# Patient Record
Sex: Male | Born: 1980 | Race: White | Hispanic: No | Marital: Married | Smoking: Never smoker
Health system: Southern US, Community
[De-identification: ages and names within clinical notes are randomized; demographics above are authoritative.]

---

## 2014-07-23 ENCOUNTER — Ambulatory Visit (INDEPENDENT_AMBULATORY_CARE_PROVIDER_SITE_OTHER): Payer: 59 | Admitting: Internal Medicine

## 2014-07-23 VITALS — BP 114/78 | HR 99 | Temp 99.4°F | Resp 20 | Ht 67.0 in | Wt 171.0 lb

## 2014-07-23 DIAGNOSIS — R5081 Fever presenting with conditions classified elsewhere: Secondary | ICD-10-CM | POA: Diagnosis not present

## 2014-07-23 DIAGNOSIS — J209 Acute bronchitis, unspecified: Secondary | ICD-10-CM

## 2014-07-23 DIAGNOSIS — R05 Cough: Secondary | ICD-10-CM

## 2014-07-23 DIAGNOSIS — R059 Cough, unspecified: Secondary | ICD-10-CM

## 2014-07-23 MED ORDER — AZITHROMYCIN 250 MG PO TABS: ORAL_TABLET | ORAL | Status: DC

## 2014-07-23 MED ORDER — BENZONATATE 100 MG PO CAPS: 100.0000 mg | ORAL_CAPSULE | Freq: Three times a day (TID) | ORAL | Status: DC | PRN

## 2014-07-23 NOTE — Patient Instructions (Signed)
Zithromax as directed. Tessalon perle as directed. Increase fluids. advil for chills and fever. See your dr in follow up . If your symptoms worsen return to the ER.

## 2014-07-23 NOTE — Progress Notes (Signed)
   Subjective:    Patient ID: Jorge Dillon, male    DOB: 06/12/1980, 34 y.o.   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  Today with low grade temp despite otc advil and with continued chills, dry hacking  cough, no sputum, and aches.   Cant sleep because of the cough No nausea, vomiting, no abd pain no diarhhea,neg smoking   Review of Systems  HENT: Positive for congestion and rhinorrhea. Negative for ear discharge, ear pain and nosebleeds.   Respiratory: Positive for cough. Negative for apnea, chest tightness, shortness of breath, wheezing and stridor.   All other systems reviewed and are negative.      Objective:   Physical Exam  Constitutional: He is oriented to person, place, and time. He appears well-developed and well-nourished.  HENT:  Head: Normocephalic and atraumatic.  Right Ear: External ear normal.  Left Ear: External ear normal.  Nose: Nose normal.  Mouth/Throat: Oropharynx is clear and moist.  Eyes: Conjunctivae and EOM are normal. Pupils are equal, round, and reactive to light.  Neck: Normal range of motion.  Cardiovascular: Normal rate, regular rhythm, normal heart sounds and intact distal pulses.   Pulmonary/Chest: Effort normal and breath sounds normal.  Coughs with deep breath Coughs every few words No wheezing Sat 97%  Abdominal: Soft.  Musculoskeletal: Normal range of motion.  Neurological: He is alert and oriented to person, place, and time.  Skin: Skin is warm.  Psychiatric: He has a normal mood and affect. His behavior is normal. Judgment and thought content normal.  Nursing note and vitals reviewed.         Assessment & Plan:  1. Fever 2. Cough 3. Acute bronchitus  Will RX with zithromax and tessalon perles Increase fluids  advil or ibuprofen as directed for fever and aches.

## 2014-07-27 ENCOUNTER — Telehealth: Payer: Self-pay

## 2014-07-27 DIAGNOSIS — J209 Acute bronchitis, unspecified: Secondary | ICD-10-CM

## 2014-07-27 NOTE — Telephone Encounter (Signed)
Pt was seen for an illness and was given a z-pack.  He had took four days worth and they left to go out of town.  He only had today's dose left.  Pt's wife wants to know if we can call in only that one pill for him.  [phone]

## 2014-07-29 MED ORDER — AZITHROMYCIN 250 MG PO TABS: 250.0000 mg | ORAL_TABLET | Freq: Once | ORAL | Status: DC

## 2014-07-29 NOTE — Telephone Encounter (Signed)
Sent a pill to the pharmacy

## 2014-07-30 NOTE — Telephone Encounter (Signed)
Left message letting his wife know.

## 2018-11-09 ENCOUNTER — Ambulatory Visit: Payer: Commercial Managed Care - PPO | Admitting: Podiatry

## 2018-11-10 ENCOUNTER — Other Ambulatory Visit: Payer: Self-pay

## 2018-11-10 ENCOUNTER — Ambulatory Visit: Payer: Commercial Managed Care - PPO | Admitting: Podiatry

## 2018-11-10 ENCOUNTER — Encounter: Payer: Self-pay | Admitting: Podiatry

## 2018-11-10 VITALS — BP 133/88 | HR 83 | Temp 98.0°F | Resp 16

## 2018-11-10 DIAGNOSIS — L309 Dermatitis, unspecified: Secondary | ICD-10-CM

## 2018-11-10 MED ORDER — TERBINAFINE HCL 250 MG PO TABS: 250.0000 mg | ORAL_TABLET | Freq: Every day | ORAL | 0 refills | Status: AC

## 2018-11-10 NOTE — Progress Notes (Signed)
Subjective:   Patient ID: Jorge Dillon, male   DOB: 38 y.o.   MRN: [id number]   HPI Patient presents with discoloration of the third and fourth digits right second toe left foot approximately 1 year duration states he is taken over-the-counter fungal topical medicine without relief of symptoms and states it does not hurt or there is been no blistering or itching.  Patient does not smoke likes to be active   Review of Systems  All other systems reviewed and are negative.       Objective:  Physical Exam Vitals signs and nursing note reviewed.  Constitutional:      Appearance: He is well-developed.  Pulmonary:     Effort: Pulmonary effort is normal.  Musculoskeletal: Normal range of motion.  Skin:    General: Skin is warm.  Neurological:     Mental Status: He is alert.     Neurovascular status found to be intact muscle strength is adequate range of motion within normal limit.  Patient is found to have crusted tissue on the third and fourth digits right and slight discoloration second digit left with no proximal edema erythema or drainage noted.  Patient has good digital perfusion     Assessment:  Probability that this is a fungal infection versus possible eczema or other unknown dermatological condition     Plan:  H&P condition reviewed and will get a start him on 45 days of oral antifungal with patient stating has had liver function studies which been normal.  I then went ahead and will start him on a combination fungal steroid cream and if symptoms continue I recommended he see a dermatologist over the next several months

## 2018-11-10 NOTE — Progress Notes (Signed)
   Subjective:    Patient ID: Jorge Dillon, male    DOB: June 30, 1980, 38 y.o.   MRN: [id number]  HPI    Review of Systems  All other systems reviewed and are negative.      Objective:   Physical Exam        Assessment & Plan:

## 2019-01-31 ENCOUNTER — Telehealth: Payer: Self-pay | Admitting: Gastroenterology

## 2019-01-31 NOTE — Telephone Encounter (Signed)
Dr Havery Moros this patient Jorge Dillon is wanting to transfer care from [facility] to [place] and is requesting you as his doctor. I have his medical records and am going to get them to you for review.

## 2019-02-02 NOTE — Telephone Encounter (Signed)
I accept his case and can see him in clinic.

## 2019-02-06 NOTE — Telephone Encounter (Signed)
Called patient to let him know that Dr. Havery Moros has reviewed his records and will see him. Left voicemail to call back and schedule.

## 2019-02-07 ENCOUNTER — Encounter: Payer: Self-pay | Admitting: Gastroenterology

## 2019-03-01 ENCOUNTER — Other Ambulatory Visit: Payer: Self-pay | Admitting: Gastroenterology

## 2019-03-01 DIAGNOSIS — R1011 Right upper quadrant pain: Secondary | ICD-10-CM

## 2019-03-02 ENCOUNTER — Ambulatory Visit
Admission: RE | Admit: 2019-03-02 | Discharge: 2019-03-02 | Disposition: A | Discharge status: Home / Self Care | Payer: Commercial Managed Care - PPO | Source: Ambulatory Visit | Attending: Gastroenterology | Admitting: Gastroenterology

## 2019-03-02 DIAGNOSIS — R1011 Right upper quadrant pain: Secondary | ICD-10-CM

## 2019-03-13 ENCOUNTER — Ambulatory Visit: Payer: Self-pay | Admitting: Gastroenterology

## 2020-04-20 IMAGING — US US ABDOMEN LIMITED
1 series · 14 of 25 positions shown · non-contrast
Comparison: None.

CLINICAL DATA: Mid abdomen pain for 1 month.

EXAM:
ULTRASOUND ABDOMEN LIMITED RIGHT UPPER QUADRANT

[Series 1: us abdomen limited · 0.19mm/px · 14 of 38 slices shown]
[im 1/38]
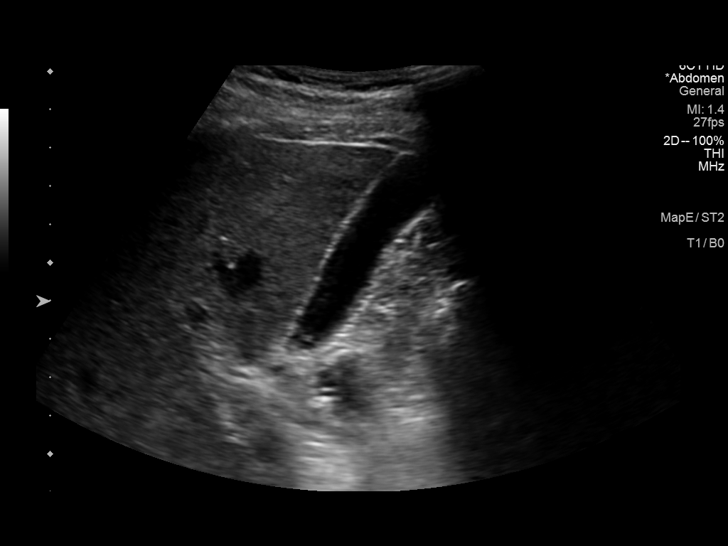
[im 4/38]
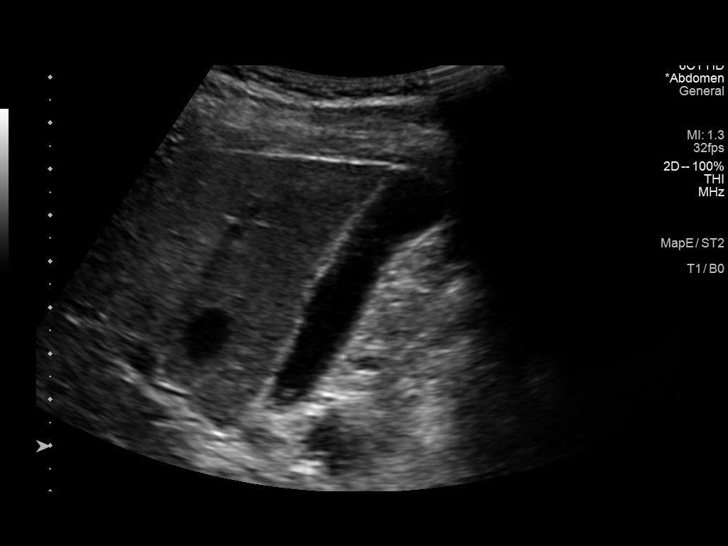
[im 7/38]
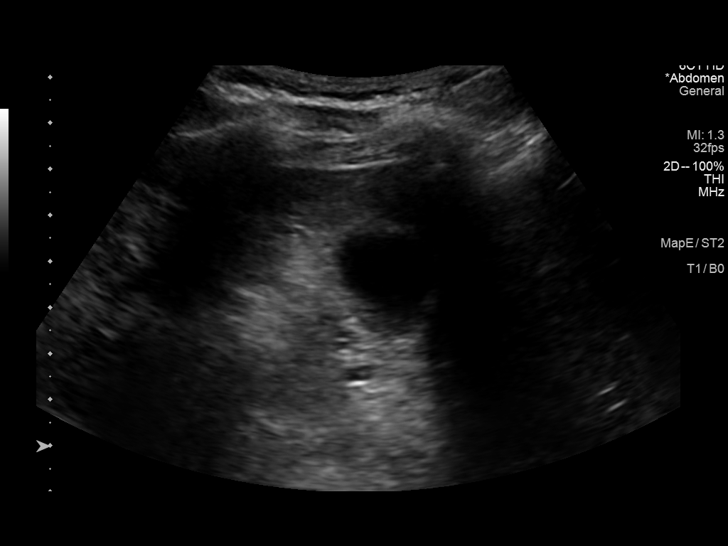
[im 10/38]
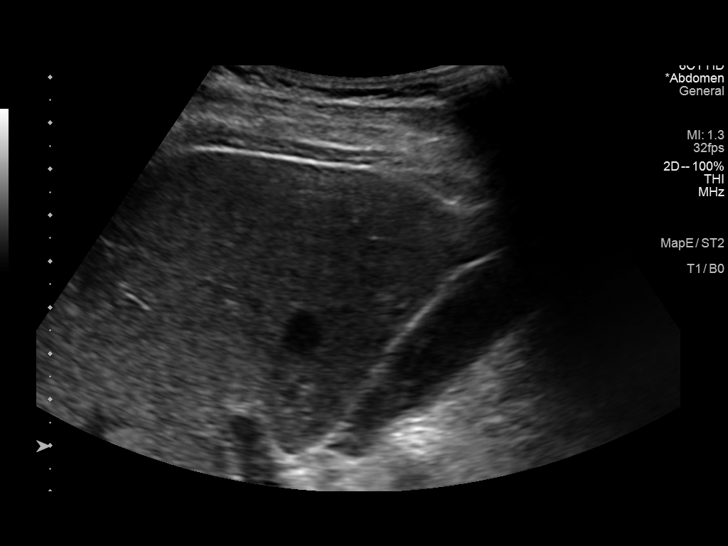
[im 13/38]
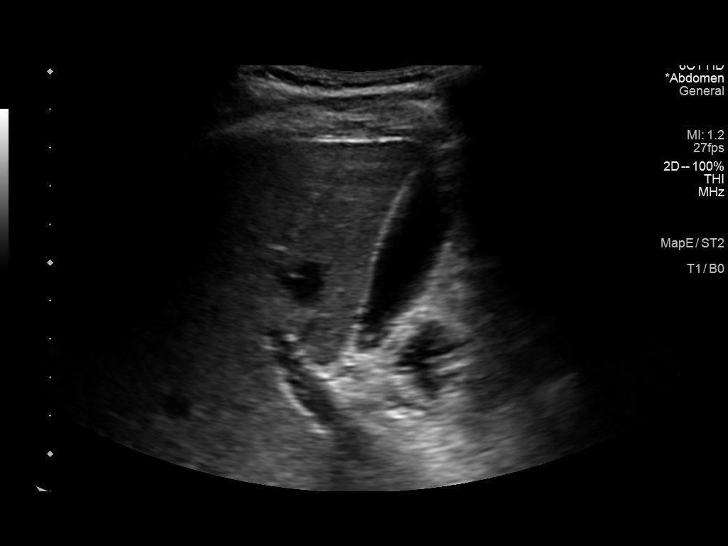
[im 14/38]
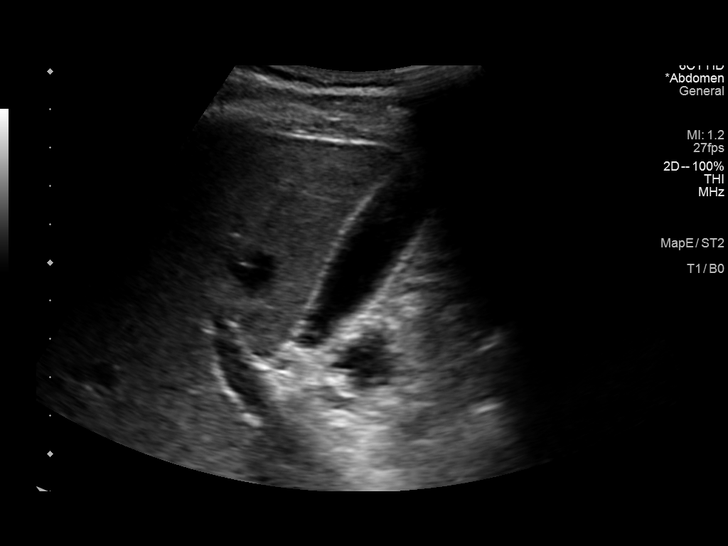
[im 17/38]
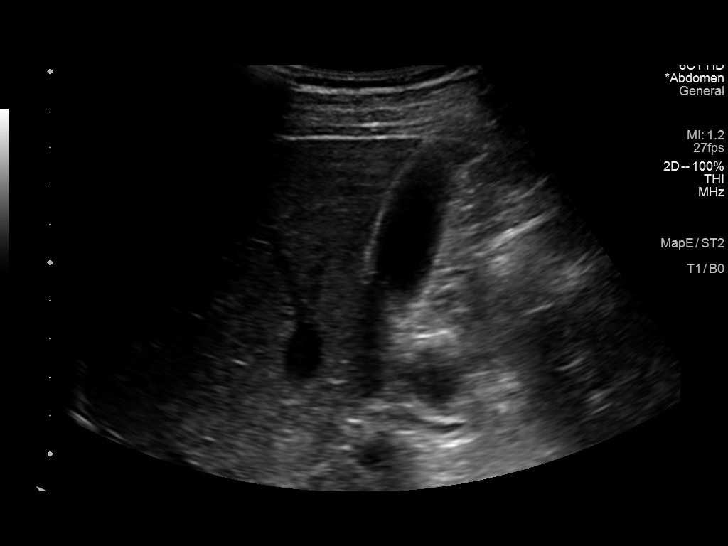
[im 21/38]
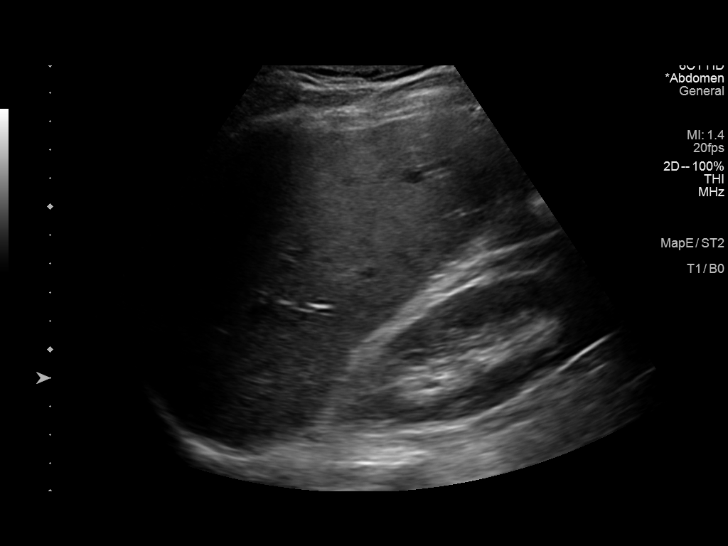
[im 24/38]
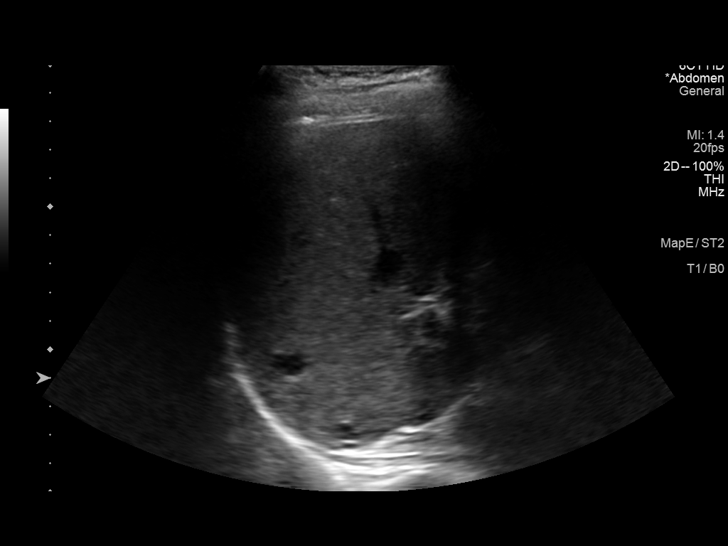
[im 25/38]
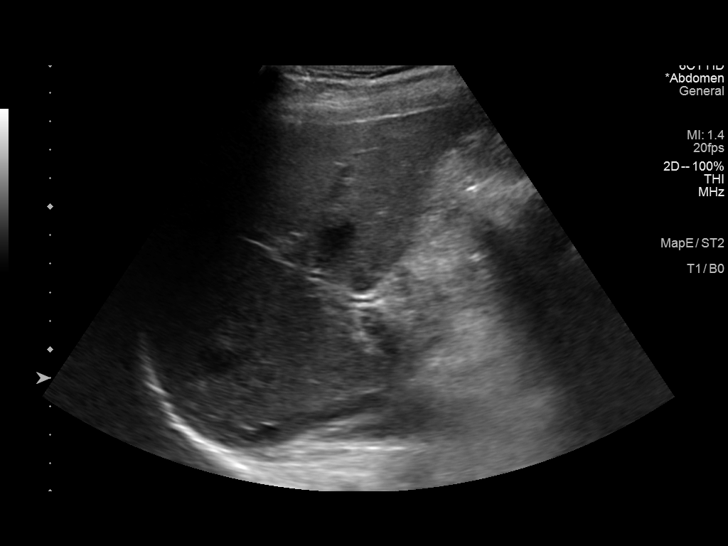
[im 28/38]
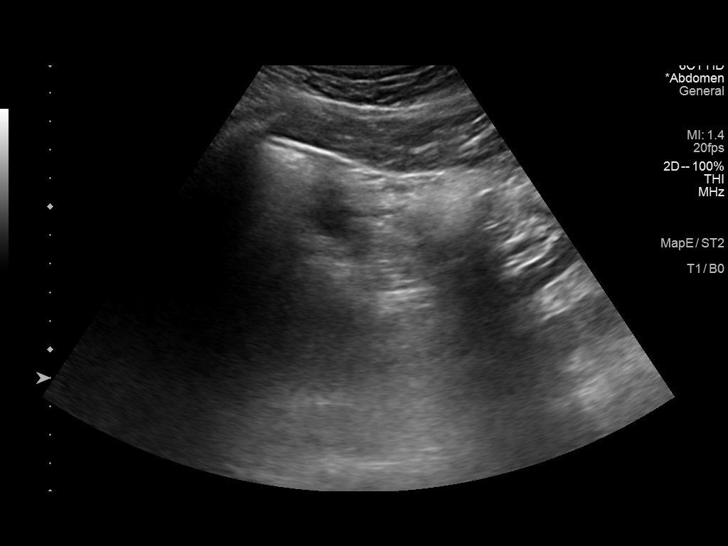
[im 31/38]
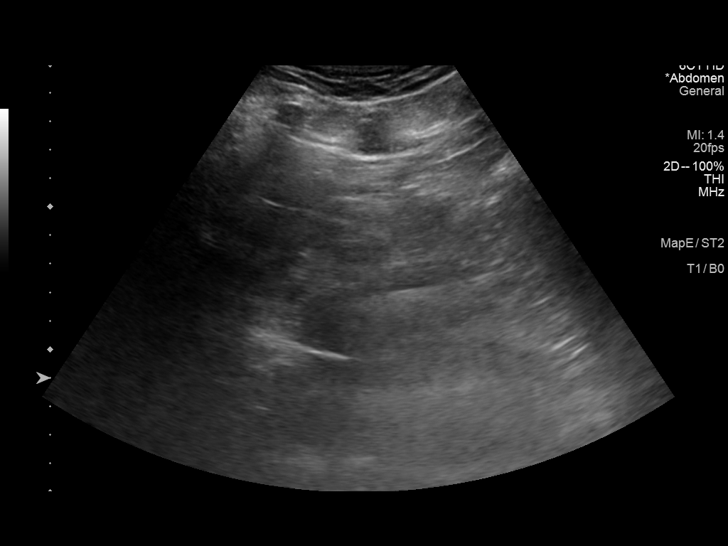
[im 34/38]
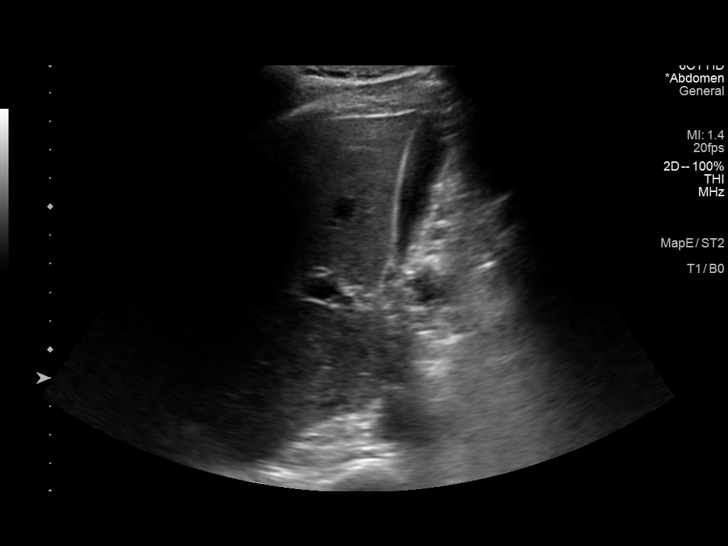
[im 38/38]
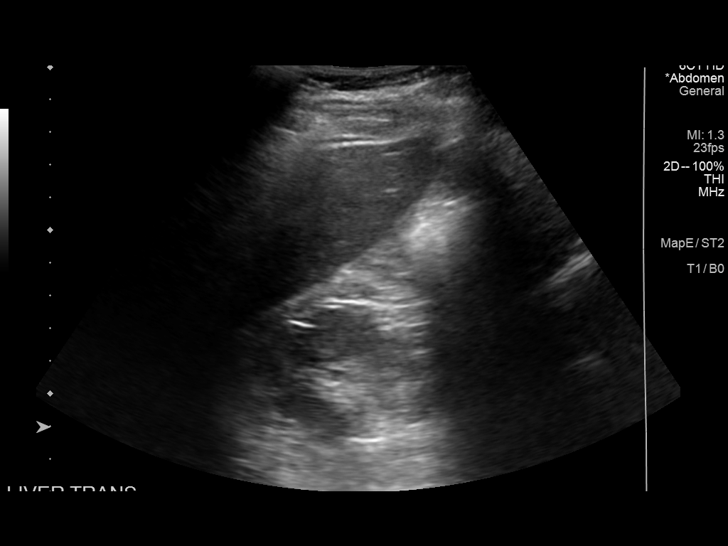

[14 of 25 positions shown; findings below may reference images not displayed]

FINDINGS: Gallbladder:

There is a question 3 mm gallbladder polyp. The gallbladder wall is
normal. There is no pericholecystic fluid. No sonographic Murphy
sign is noted.

Common bile duct:

Diameter: 2.6 mm

Liver:

No focal lesion identified. Within normal limits in parenchymal
echogenicity. Portal vein is patent on color Doppler imaging with
normal direction of blood flow towards the liver.

Other: None.
IMPRESSION: Question 3 mm gallbladder polyp. There is no evidence of acute
cholecystitis.

## 2020-08-02 ENCOUNTER — Other Ambulatory Visit: Payer: Self-pay | Admitting: Physician Assistant

## 2020-08-02 DIAGNOSIS — R1013 Epigastric pain: Secondary | ICD-10-CM

## 2020-08-08 ENCOUNTER — Ambulatory Visit
Admission: RE | Admit: 2020-08-08 | Discharge: 2020-08-08 | Disposition: A | Discharge status: Home / Self Care | Payer: Commercial Managed Care - PPO | Source: Ambulatory Visit | Attending: Physician Assistant | Admitting: Physician Assistant

## 2020-08-08 DIAGNOSIS — R1013 Epigastric pain: Secondary | ICD-10-CM

## 2020-08-26 ENCOUNTER — Other Ambulatory Visit: Payer: Commercial Managed Care - PPO

## 2021-09-27 IMAGING — US US ABDOMEN LIMITED RUQ/ASCITES
1 series · 14 of 25 positions shown · non-contrast
Comparison: Ultrasound abdomen 03/02/2019

CLINICAL DATA: Epigastric pain 2 weeks

EXAM:
ULTRASOUND ABDOMEN LIMITED RIGHT UPPER QUADRANT

[Series 1: us abdomen limited ruq/ascites · 0.14mm/px · 14 of 50 slices shown]
[im 1/50]
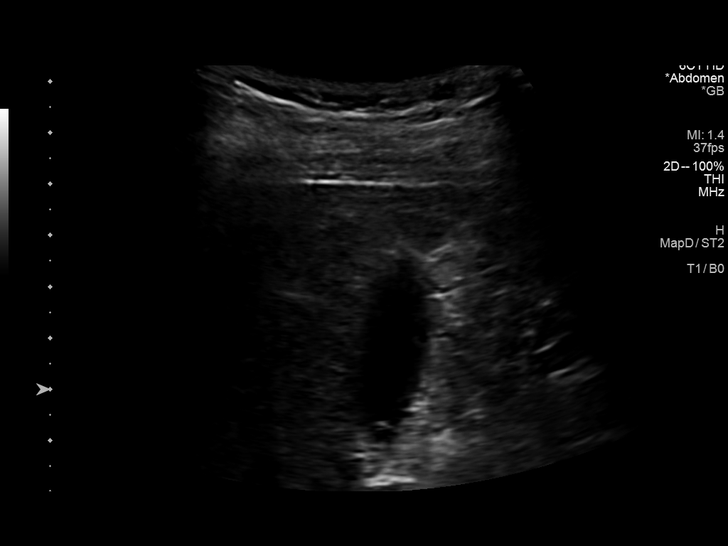
[im 5/50]
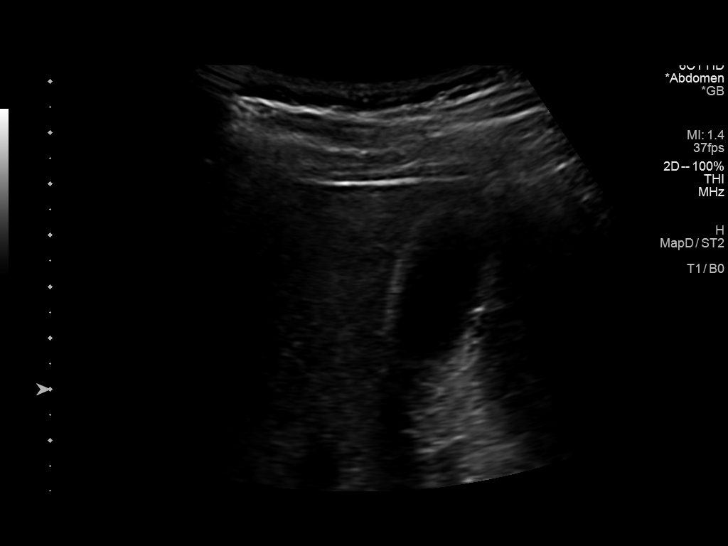
[im 9/50]
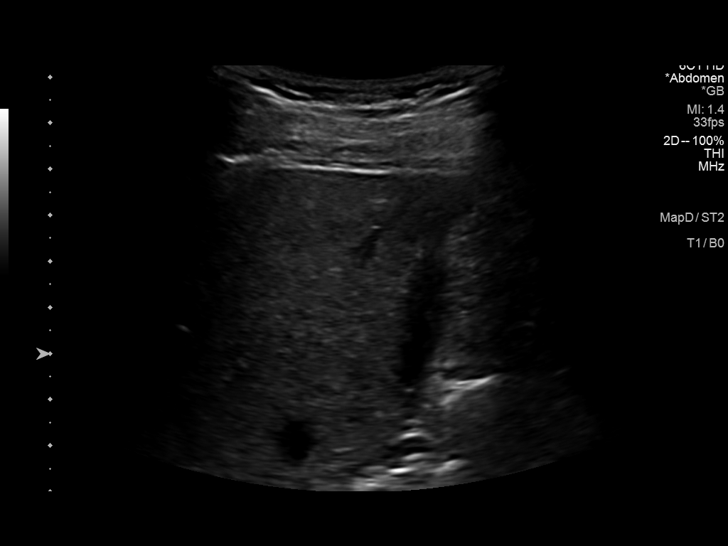
[im 13/50]
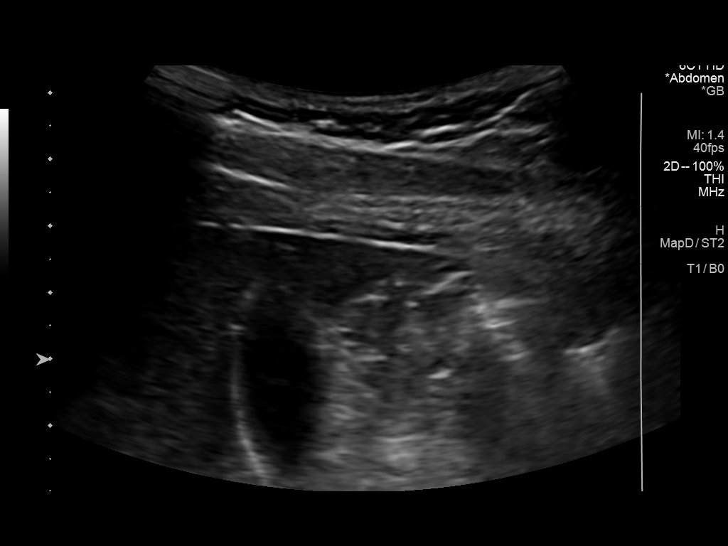
[im 17/50]
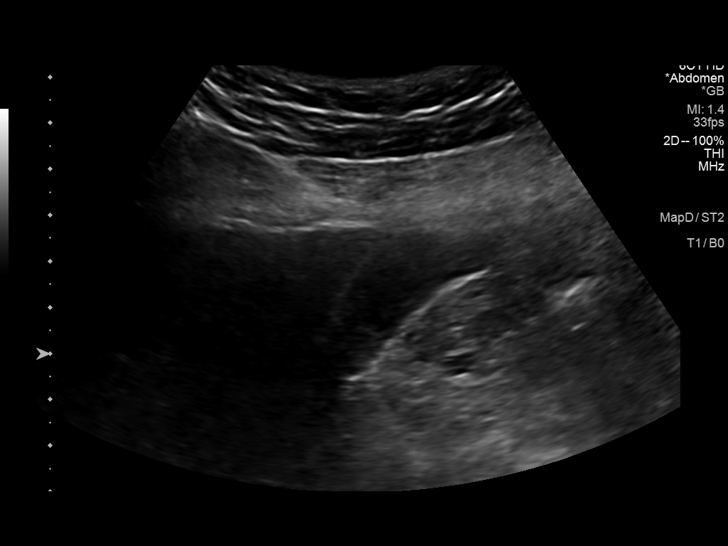
[im 19/50]
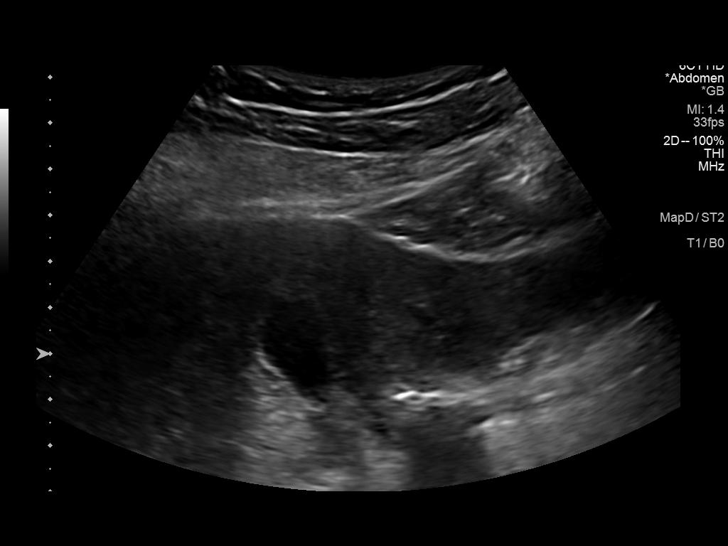
[im 23/50]
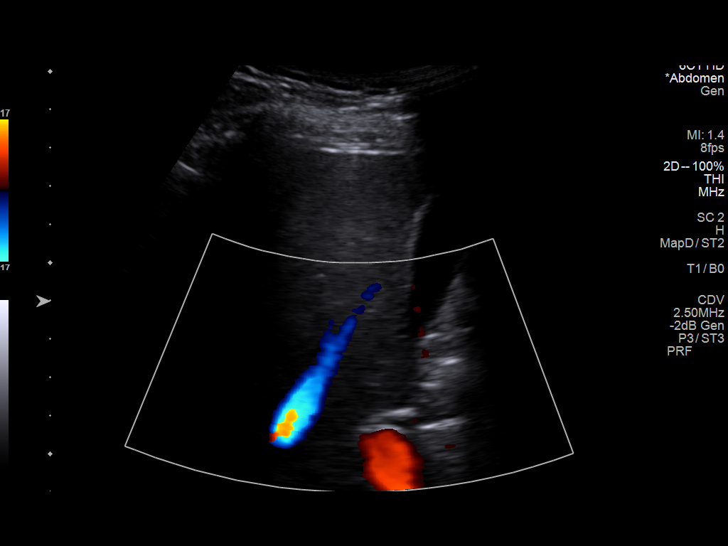
[im 27/50]
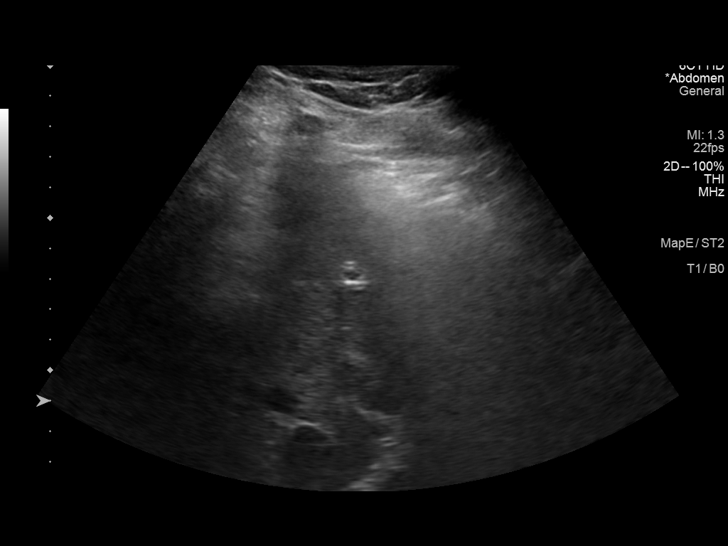
[im 31/50]
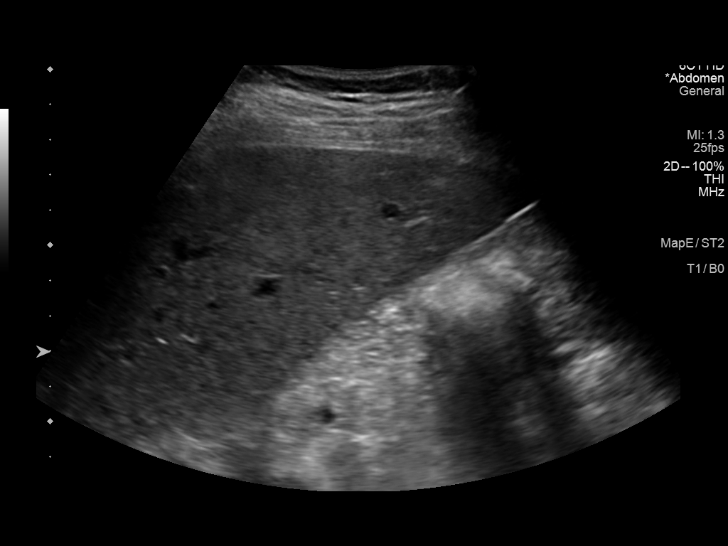
[im 33/50]
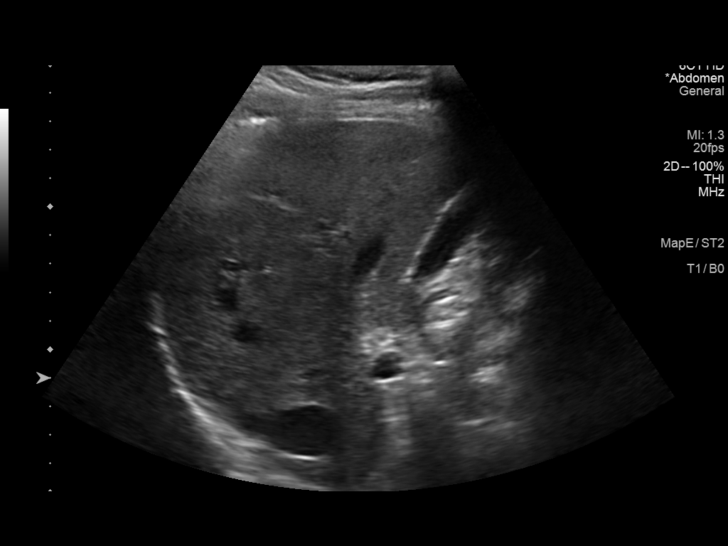
[im 37/50]
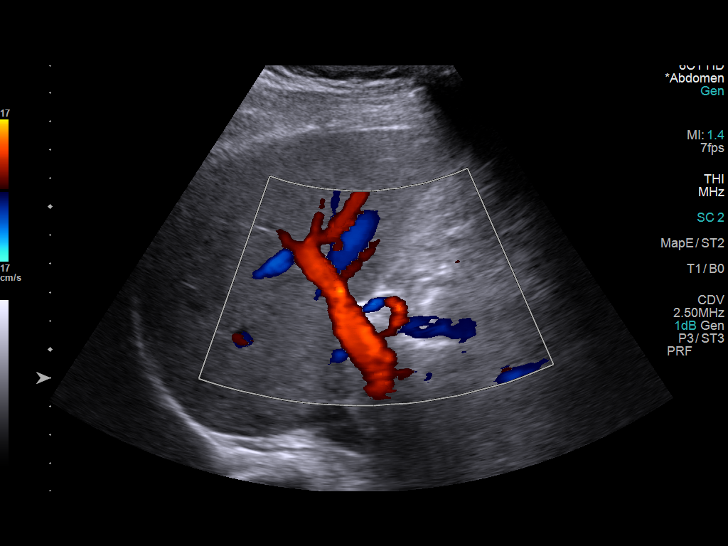
[im 41/50]
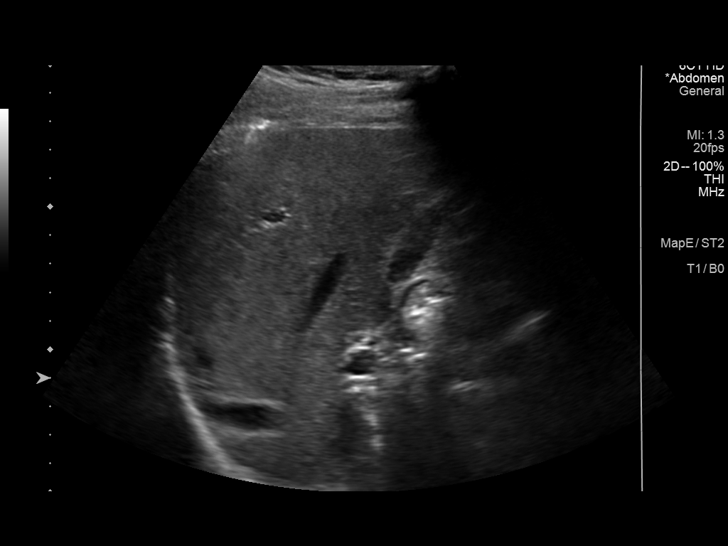
[im 45/50]
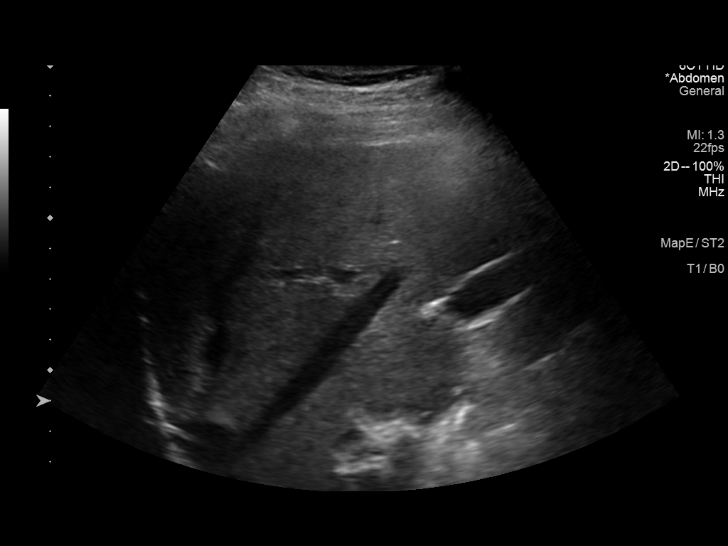
[im 50/50]
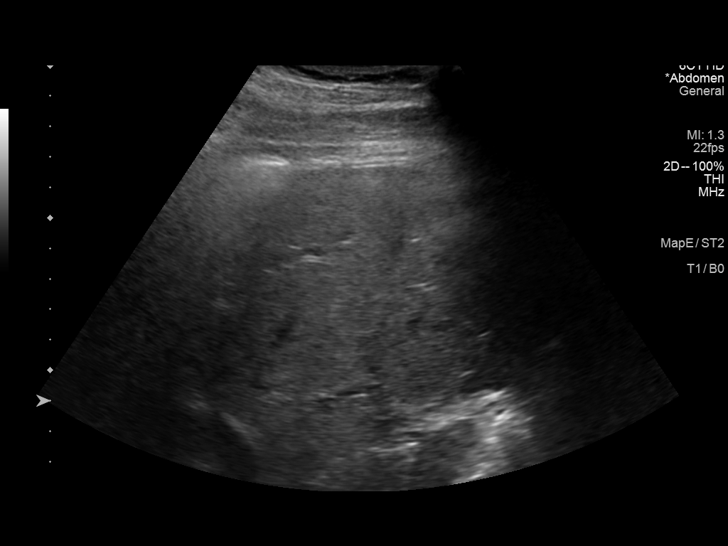

[14 of 25 positions shown; findings below may reference images not displayed]

FINDINGS: Gallbladder:

3 mm gallbladder polyp in the gallbladder neck unchanged from the
prior study. No gallstones or gallbladder wall thickening. Negative
sonographic Murphy sign

Common bile duct:

Diameter: 3.5 mm.

Liver:

No focal lesion identified. Within normal limits in parenchymal
echogenicity. Portal vein is patent on color Doppler imaging with
normal direction of blood flow towards the liver.

Other: None.
IMPRESSION: 3 mm gallbladder polyp unchanged.  No evidence of cholecystitis.
